# Patient Record
Sex: Female | Born: 1985 | Hispanic: No | Marital: Married | Smoking: Former smoker
Health system: Southern US, Community
[De-identification: ages and names within clinical notes are randomized; demographics above are authoritative.]

---

## 2014-05-13 LAB — OB RESULTS CONSOLE ABO/RH: RH Type: POSITIVE

## 2014-05-13 LAB — OB RESULTS CONSOLE RUBELLA ANTIBODY, IGM: Rubella: IMMUNE

## 2014-05-13 LAB — OB RESULTS CONSOLE HEPATITIS B SURFACE ANTIGEN: Hepatitis B Surface Ag: NEGATIVE

## 2014-05-13 LAB — OB RESULTS CONSOLE RPR: RPR: NONREACTIVE

## 2014-05-13 LAB — OB RESULTS CONSOLE ANTIBODY SCREEN: Antibody Screen: NEGATIVE

## 2014-05-13 LAB — OB RESULTS CONSOLE HIV ANTIBODY (ROUTINE TESTING): HIV: NONREACTIVE

## 2014-05-23 LAB — OB RESULTS CONSOLE GC/CHLAMYDIA
Chlamydia: NEGATIVE
Gonorrhea: NEGATIVE

## 2014-08-12 ENCOUNTER — Inpatient Hospital Stay (HOSPITAL_COMMUNITY): Admission: AD | Admit: 2014-08-12 | Payer: Self-pay | Source: Ambulatory Visit | Admitting: Obstetrics and Gynecology

## 2014-11-15 LAB — OB RESULTS CONSOLE GBS: GBS: NEGATIVE

## 2014-12-09 ENCOUNTER — Inpatient Hospital Stay (HOSPITAL_COMMUNITY): Payer: Managed Care, Other (non HMO) | Admitting: Anesthesiology

## 2014-12-09 ENCOUNTER — Encounter (HOSPITAL_COMMUNITY)
Admission: AD | Disposition: A | Discharge status: Home / Self Care | Payer: Self-pay | Source: Ambulatory Visit | Attending: Obstetrics and Gynecology

## 2014-12-09 ENCOUNTER — Inpatient Hospital Stay (HOSPITAL_COMMUNITY): Payer: Managed Care, Other (non HMO)

## 2014-12-09 ENCOUNTER — Inpatient Hospital Stay (HOSPITAL_COMMUNITY)
Admission: AD | Admit: 2014-12-09 | Discharge: 2014-12-12 | DRG: 766 | Disposition: A | Discharge status: Home / Self Care | Payer: Managed Care, Other (non HMO) | Source: Ambulatory Visit | Attending: Obstetrics and Gynecology | Admitting: Obstetrics and Gynecology

## 2014-12-09 ENCOUNTER — Encounter (HOSPITAL_COMMUNITY): Payer: Self-pay | Admitting: *Deleted

## 2014-12-09 DIAGNOSIS — O9081 Anemia of the puerperium: Secondary | ICD-10-CM | POA: Diagnosis not present

## 2014-12-09 DIAGNOSIS — D509 Iron deficiency anemia, unspecified: Secondary | ICD-10-CM | POA: Diagnosis present

## 2014-12-09 DIAGNOSIS — O99019 Anemia complicating pregnancy, unspecified trimester: Secondary | ICD-10-CM

## 2014-12-09 DIAGNOSIS — Z3A38 38 weeks gestation of pregnancy: Secondary | ICD-10-CM | POA: Diagnosis present

## 2014-12-09 DIAGNOSIS — O4290 Premature rupture of membranes, unspecified as to length of time between rupture and onset of labor, unspecified weeks of gestation: Secondary | ICD-10-CM | POA: Diagnosis present

## 2014-12-09 DIAGNOSIS — Z3403 Encounter for supervision of normal first pregnancy, third trimester: Secondary | ICD-10-CM | POA: Diagnosis present

## 2014-12-09 DIAGNOSIS — D62 Acute posthemorrhagic anemia: Secondary | ICD-10-CM | POA: Diagnosis not present

## 2014-12-09 DIAGNOSIS — Z09 Encounter for follow-up examination after completed treatment for conditions other than malignant neoplasm: Secondary | ICD-10-CM

## 2014-12-09 DIAGNOSIS — D259 Leiomyoma of uterus, unspecified: Secondary | ICD-10-CM | POA: Diagnosis present

## 2014-12-09 LAB — CBC
HCT: 31.7 % — ABNORMAL LOW (ref 36.0–46.0)
Hemoglobin: 10.6 g/dL — ABNORMAL LOW (ref 12.0–15.0)
MCH: 29.2 pg (ref 26.0–34.0)
MCHC: 33.4 g/dL (ref 30.0–36.0)
MCV: 87.3 fL (ref 78.0–100.0)
PLATELETS: 222 10*3/uL (ref 150–400)
RBC: 3.63 MIL/uL — AB (ref 3.87–5.11)
RDW: 13.3 % (ref 11.5–15.5)
WBC: 12.1 10*3/uL — AB (ref 4.0–10.5)

## 2014-12-09 LAB — AMNISURE RUPTURE OF MEMBRANE (ROM) NOT AT ARMC: AMNISURE: POSITIVE

## 2014-12-09 SURGERY — Surgical Case
Anesthesia: General

## 2014-12-09 MED ORDER — SODIUM CHLORIDE 0.9 % IV SOLN: 250.0000 mL | INTRAVENOUS | Status: DC

## 2014-12-09 MED ORDER — 0.9 % SODIUM CHLORIDE (POUR BTL) OPTIME
TOPICAL | Status: DC | PRN
  Administered 2014-12-09: 1000 mL

## 2014-12-09 MED ORDER — ONDANSETRON HCL 4 MG/2ML IJ SOLN
INTRAMUSCULAR | Status: AC
  Filled 2014-12-09: qty 2

## 2014-12-09 MED ORDER — ONDANSETRON HCL 4 MG/2ML IJ SOLN
4.0000 mg | INTRAMUSCULAR | Status: DC | PRN
  Administered 2014-12-09: 4 mg via INTRAVENOUS
  Filled 2014-12-09: qty 2

## 2014-12-09 MED ORDER — DIPHENHYDRAMINE HCL 50 MG/ML IJ SOLN: 12.5000 mg | Freq: Four times a day (QID) | INTRAMUSCULAR | Status: DC | PRN

## 2014-12-09 MED ORDER — PROPOFOL 10 MG/ML IV BOLUS
INTRAVENOUS | Status: AC
  Filled 2014-12-09: qty 20

## 2014-12-09 MED ORDER — HYDROMORPHONE 0.3 MG/ML IV SOLN
INTRAVENOUS | Status: DC
  Administered 2014-12-09: 0.3 mg via INTRAVENOUS
  Administered 2014-12-09: 0.5 mg via INTRAVENOUS
  Administered 2014-12-10: 0.8 mg via INTRAVENOUS
  Administered 2014-12-10: 1.39 mg via INTRAVENOUS
  Administered 2014-12-10: 0.399 mg via INTRAVENOUS
  Filled 2014-12-09: qty 25

## 2014-12-09 MED ORDER — OXYTOCIN 10 UNIT/ML IJ SOLN
INTRAMUSCULAR | Status: AC
  Filled 2014-12-09: qty 4

## 2014-12-09 MED ORDER — METHYLERGONOVINE MALEATE 0.2 MG/ML IJ SOLN: 0.2000 mg | INTRAMUSCULAR | Status: DC | PRN

## 2014-12-09 MED ORDER — MENTHOL 3 MG MT LOZG: 1.0000 | LOZENGE | OROMUCOSAL | Status: DC | PRN

## 2014-12-09 MED ORDER — KETOROLAC TROMETHAMINE 30 MG/ML IJ SOLN
30.0000 mg | Freq: Once | INTRAMUSCULAR | Status: AC
  Administered 2014-12-09: 30 mg via INTRAVENOUS

## 2014-12-09 MED ORDER — ONDANSETRON HCL 4 MG/2ML IJ SOLN: 4.0000 mg | Freq: Four times a day (QID) | INTRAMUSCULAR | Status: DC | PRN

## 2014-12-09 MED ORDER — OXYTOCIN BOLUS FROM INFUSION: 500.0000 mL | INTRAVENOUS | Status: DC

## 2014-12-09 MED ORDER — SENNOSIDES-DOCUSATE SODIUM 8.6-50 MG PO TABS
2.0000 | ORAL_TABLET | ORAL | Status: DC
  Administered 2014-12-10 – 2014-12-12 (×3): 2 via ORAL
  Filled 2014-12-09 (×4): qty 2

## 2014-12-09 MED ORDER — NALOXONE HCL 0.4 MG/ML IJ SOLN: 0.4000 mg | INTRAMUSCULAR | Status: DC | PRN

## 2014-12-09 MED ORDER — FENTANYL CITRATE 0.05 MG/ML IJ SOLN
INTRAMUSCULAR | Status: DC | PRN
  Administered 2014-12-09 (×2): 50 ug via INTRAVENOUS
  Administered 2014-12-09: 250 ug via INTRAVENOUS

## 2014-12-09 MED ORDER — FENTANYL CITRATE 0.05 MG/ML IJ SOLN
INTRAMUSCULAR | Status: AC
  Filled 2014-12-09: qty 2

## 2014-12-09 MED ORDER — SUCCINYLCHOLINE CHLORIDE 20 MG/ML IJ SOLN
INTRAMUSCULAR | Status: DC | PRN
  Administered 2014-12-09: 140 mg via INTRAVENOUS

## 2014-12-09 MED ORDER — LACTATED RINGERS IV SOLN: 500.0000 mL | INTRAVENOUS | Status: DC | PRN

## 2014-12-09 MED ORDER — PROPOFOL 10 MG/ML IV BOLUS
INTRAVENOUS | Status: DC | PRN
  Administered 2014-12-09: 200 mg via INTRAVENOUS

## 2014-12-09 MED ORDER — LIDOCAINE HCL (PF) 1 % IJ SOLN: 30.0000 mL | INTRAMUSCULAR | Status: DC | PRN

## 2014-12-09 MED ORDER — LACTATED RINGERS IV SOLN
INTRAVENOUS | Status: DC
  Administered 2014-12-10: 03:00:00 via INTRAVENOUS

## 2014-12-09 MED ORDER — OXYCODONE-ACETAMINOPHEN 5-325 MG PO TABS
2.0000 | ORAL_TABLET | ORAL | Status: DC | PRN
Start: 2014-12-09 — End: 2014-12-09

## 2014-12-09 MED ORDER — BISACODYL 10 MG RE SUPP: 10.0000 mg | Freq: Every day | RECTAL | Status: DC | PRN

## 2014-12-09 MED ORDER — OXYCODONE-ACETAMINOPHEN 5-325 MG PO TABS
1.0000 | ORAL_TABLET | ORAL | Status: DC | PRN
  Administered 2014-12-10 (×3): 1 via ORAL
  Filled 2014-12-09 (×3): qty 1

## 2014-12-09 MED ORDER — METHYLERGONOVINE MALEATE 0.2 MG PO TABS: 0.2000 mg | ORAL_TABLET | ORAL | Status: DC | PRN

## 2014-12-09 MED ORDER — OXYTOCIN 10 UNIT/ML IJ SOLN: 10.0000 [IU] | Freq: Once | INTRAMUSCULAR | Status: DC

## 2014-12-09 MED ORDER — KETOROLAC TROMETHAMINE 60 MG/2ML IM SOLN
INTRAMUSCULAR | Status: AC
  Filled 2014-12-09: qty 2

## 2014-12-09 MED ORDER — ONDANSETRON HCL 4 MG/2ML IJ SOLN
INTRAMUSCULAR | Status: DC | PRN
  Administered 2014-12-09: 4 mg via INTRAVENOUS

## 2014-12-09 MED ORDER — FERROUS SULFATE 325 (65 FE) MG PO TABS
325.0000 mg | ORAL_TABLET | Freq: Two times a day (BID) | ORAL | Status: DC
  Administered 2014-12-10 – 2014-12-12 (×5): 325 mg via ORAL
  Filled 2014-12-09 (×5): qty 1

## 2014-12-09 MED ORDER — CLINDAMYCIN PHOSPHATE 900 MG/50ML IV SOLN: 900.0000 mg | Freq: Three times a day (TID) | INTRAVENOUS | Status: DC

## 2014-12-09 MED ORDER — KETOROLAC TROMETHAMINE 30 MG/ML IJ SOLN
30.0000 mg | Freq: Once | INTRAMUSCULAR | Status: AC
  Administered 2014-12-09: 30 mg via INTRAMUSCULAR

## 2014-12-09 MED ORDER — ZOLPIDEM TARTRATE 5 MG PO TABS: 5.0000 mg | ORAL_TABLET | Freq: Every evening | ORAL | Status: DC | PRN

## 2014-12-09 MED ORDER — OXYTOCIN 40 UNITS IN LACTATED RINGERS INFUSION - SIMPLE MED: 1.0000 m[IU]/min | INTRAVENOUS | Status: DC

## 2014-12-09 MED ORDER — IBUPROFEN 600 MG PO TABS
600.0000 mg | ORAL_TABLET | Freq: Four times a day (QID) | ORAL | Status: DC
  Administered 2014-12-10 – 2014-12-12 (×10): 600 mg via ORAL
  Filled 2014-12-09 (×10): qty 1

## 2014-12-09 MED ORDER — DIPHENHYDRAMINE HCL 12.5 MG/5ML PO ELIX
12.5000 mg | ORAL_SOLUTION | Freq: Four times a day (QID) | ORAL | Status: DC | PRN
  Filled 2014-12-09: qty 5

## 2014-12-09 MED ORDER — DIBUCAINE 1 % RE OINT: 1.0000 "application " | TOPICAL_OINTMENT | RECTAL | Status: DC | PRN

## 2014-12-09 MED ORDER — DIPHENHYDRAMINE HCL 25 MG PO CAPS: 25.0000 mg | ORAL_CAPSULE | Freq: Four times a day (QID) | ORAL | Status: DC | PRN

## 2014-12-09 MED ORDER — FENTANYL CITRATE 0.05 MG/ML IJ SOLN
INTRAMUSCULAR | Status: AC
  Filled 2014-12-09: qty 5

## 2014-12-09 MED ORDER — LANOLIN HYDROUS EX OINT: 1.0000 "application " | TOPICAL_OINTMENT | CUTANEOUS | Status: DC | PRN

## 2014-12-09 MED ORDER — METHYLERGONOVINE MALEATE 0.2 MG/ML IJ SOLN
INTRAMUSCULAR | Status: DC | PRN
  Administered 2014-12-09: 0.2 mg via INTRAMUSCULAR

## 2014-12-09 MED ORDER — PROMETHAZINE HCL 25 MG/ML IJ SOLN: 6.2500 mg | INTRAMUSCULAR | Status: DC | PRN

## 2014-12-09 MED ORDER — SODIUM CHLORIDE 0.9 % IJ SOLN: 3.0000 mL | INTRAMUSCULAR | Status: DC | PRN

## 2014-12-09 MED ORDER — SODIUM CHLORIDE 0.9 % IJ SOLN: 3.0000 mL | Freq: Two times a day (BID) | INTRAMUSCULAR | Status: DC

## 2014-12-09 MED ORDER — HYDROMORPHONE HCL 1 MG/ML IJ SOLN
INTRAMUSCULAR | Status: AC
  Filled 2014-12-09: qty 1

## 2014-12-09 MED ORDER — ONDANSETRON HCL 4 MG PO TABS: 4.0000 mg | ORAL_TABLET | ORAL | Status: DC | PRN

## 2014-12-09 MED ORDER — CITRIC ACID-SODIUM CITRATE 334-500 MG/5ML PO SOLN: 30.0000 mL | ORAL | Status: DC | PRN

## 2014-12-09 MED ORDER — SIMETHICONE 80 MG PO CHEW
80.0000 mg | CHEWABLE_TABLET | ORAL | Status: DC
  Administered 2014-12-10 – 2014-12-12 (×3): 80 mg via ORAL
  Filled 2014-12-09 (×3): qty 1

## 2014-12-09 MED ORDER — SIMETHICONE 80 MG PO CHEW
80.0000 mg | CHEWABLE_TABLET | Freq: Three times a day (TID) | ORAL | Status: DC
  Administered 2014-12-10 – 2014-12-12 (×7): 80 mg via ORAL
  Filled 2014-12-09 (×6): qty 1

## 2014-12-09 MED ORDER — HYDROMORPHONE HCL 1 MG/ML IJ SOLN
0.2500 mg | INTRAMUSCULAR | Status: DC | PRN
  Administered 2014-12-09 (×2): 0.5 mg via INTRAVENOUS

## 2014-12-09 MED ORDER — OXYTOCIN 40 UNITS IN LACTATED RINGERS INFUSION - SIMPLE MED: 62.5000 mL/h | INTRAVENOUS | Status: DC

## 2014-12-09 MED ORDER — HYDROMORPHONE HCL 1 MG/ML IJ SOLN
INTRAMUSCULAR | Status: DC | PRN
  Administered 2014-12-09: 1 mg via INTRAVENOUS

## 2014-12-09 MED ORDER — PRENATAL MULTIVITAMIN CH
1.0000 | ORAL_TABLET | Freq: Every day | ORAL | Status: DC
  Administered 2014-12-10 – 2014-12-11 (×2): 1 via ORAL
  Filled 2014-12-09 (×2): qty 1

## 2014-12-09 MED ORDER — LACTATED RINGERS IV SOLN
INTRAVENOUS | Status: DC
  Administered 2014-12-09 (×2): via INTRAVENOUS

## 2014-12-09 MED ORDER — HYDROMORPHONE HCL 1 MG/ML IJ SOLN
INTRAMUSCULAR | Status: AC
  Administered 2014-12-09: 0.5 mg via INTRAVENOUS
  Filled 2014-12-09: qty 1

## 2014-12-09 MED ORDER — SUCCINYLCHOLINE CHLORIDE 20 MG/ML IJ SOLN
INTRAMUSCULAR | Status: AC
  Filled 2014-12-09: qty 1

## 2014-12-09 MED ORDER — NALBUPHINE HCL 10 MG/ML IJ SOLN: 5.0000 mg | INTRAMUSCULAR | Status: DC | PRN

## 2014-12-09 MED ORDER — BUTORPHANOL TARTRATE 1 MG/ML IJ SOLN: 1.0000 mg | INTRAMUSCULAR | Status: DC | PRN

## 2014-12-09 MED ORDER — AMPICILLIN SODIUM 2 G IJ SOLR
2.0000 g | Freq: Once | INTRAMUSCULAR | Status: DC
  Filled 2014-12-09: qty 2000

## 2014-12-09 MED ORDER — WITCH HAZEL-GLYCERIN EX PADS: 1.0000 "application " | MEDICATED_PAD | CUTANEOUS | Status: DC | PRN

## 2014-12-09 MED ORDER — OXYTOCIN 10 UNIT/ML IJ SOLN
40.0000 [IU] | INTRAVENOUS | Status: DC | PRN
  Administered 2014-12-09: 40 [IU] via INTRAVENOUS

## 2014-12-09 MED ORDER — OXYCODONE-ACETAMINOPHEN 5-325 MG PO TABS: 1.0000 | ORAL_TABLET | ORAL | Status: DC | PRN

## 2014-12-09 MED ORDER — CEFAZOLIN SODIUM-DEXTROSE 2-3 GM-% IV SOLR
INTRAVENOUS | Status: DC | PRN
  Administered 2014-12-09: 2 g via INTRAVENOUS

## 2014-12-09 MED ORDER — DEXTROSE IN LACTATED RINGERS 5 % IV SOLN
INTRAVENOUS | Status: DC
  Administered 2014-12-09: 19:00:00 via INTRAVENOUS

## 2014-12-09 MED ORDER — FLEET ENEMA 7-19 GM/118ML RE ENEM: 1.0000 | ENEMA | Freq: Every day | RECTAL | Status: DC | PRN

## 2014-12-09 MED ORDER — MIDAZOLAM HCL 2 MG/2ML IJ SOLN
INTRAMUSCULAR | Status: DC | PRN
  Administered 2014-12-09: 2 mg via INTRAVENOUS

## 2014-12-09 MED ORDER — SODIUM CHLORIDE 0.9 % IJ SOLN: 9.0000 mL | INTRAMUSCULAR | Status: DC | PRN

## 2014-12-09 MED ORDER — CEFAZOLIN SODIUM-DEXTROSE 2-3 GM-% IV SOLR
2.0000 g | Freq: Three times a day (TID) | INTRAVENOUS | Status: DC
  Administered 2014-12-10: 2 g via INTRAVENOUS
  Filled 2014-12-09 (×3): qty 50

## 2014-12-09 MED ORDER — ACETAMINOPHEN 325 MG PO TABS: 650.0000 mg | ORAL_TABLET | ORAL | Status: DC | PRN

## 2014-12-09 MED ORDER — OXYCODONE-ACETAMINOPHEN 5-325 MG PO TABS: 2.0000 | ORAL_TABLET | ORAL | Status: DC | PRN

## 2014-12-09 MED ORDER — MEPERIDINE HCL 25 MG/ML IJ SOLN: 6.2500 mg | INTRAMUSCULAR | Status: DC | PRN

## 2014-12-09 MED ORDER — TERBUTALINE SULFATE 1 MG/ML IJ SOLN: 0.2500 mg | Freq: Once | INTRAMUSCULAR | Status: DC | PRN

## 2014-12-09 MED ORDER — SIMETHICONE 80 MG PO CHEW: 80.0000 mg | CHEWABLE_TABLET | ORAL | Status: DC | PRN

## 2014-12-09 MED ORDER — MIDAZOLAM HCL 2 MG/2ML IJ SOLN
INTRAMUSCULAR | Status: AC
  Filled 2014-12-09: qty 2

## 2014-12-09 SURGICAL SUPPLY — 40 items
BARRIER ADHS 3X4 INTERCEED (GAUZE/BANDAGES/DRESSINGS) ×3 IMPLANT
BENZOIN TINCTURE PRP APPL 2/3 (GAUZE/BANDAGES/DRESSINGS) IMPLANT
CLAMP CORD UMBIL (MISCELLANEOUS) IMPLANT
CLOSURE WOUND 1/2 X4 (GAUZE/BANDAGES/DRESSINGS)
CLOTH BEACON ORANGE TIMEOUT ST (SAFETY) ×3 IMPLANT
CONTAINER PREFILL 10% NBF 15ML (MISCELLANEOUS) IMPLANT
DRAPE SHEET LG 3/4 BI-LAMINATE (DRAPES) IMPLANT
DRSG OPSITE POSTOP 4X10 (GAUZE/BANDAGES/DRESSINGS) ×3 IMPLANT
DURAPREP 26ML APPLICATOR (WOUND CARE) ×3 IMPLANT
ELECT REM PT RETURN 9FT ADLT (ELECTROSURGICAL) ×3
ELECTRODE REM PT RTRN 9FT ADLT (ELECTROSURGICAL) ×1 IMPLANT
EXTRACTOR VACUUM M CUP 4 TUBE (SUCTIONS) IMPLANT
EXTRACTOR VACUUM M CUP 4' TUBE (SUCTIONS)
GLOVE BIOGEL PI IND STRL 7.0 (GLOVE) ×1 IMPLANT
GLOVE BIOGEL PI INDICATOR 7.0 (GLOVE) ×2
GLOVE ECLIPSE 6.5 STRL STRAW (GLOVE) ×3 IMPLANT
GOWN STRL REUS W/TWL LRG LVL3 (GOWN DISPOSABLE) ×6 IMPLANT
KIT ABG SYR 3ML LUER SLIP (SYRINGE) IMPLANT
NEEDLE HYPO 25X1 1.5 SAFETY (NEEDLE) ×3 IMPLANT
NEEDLE HYPO 25X5/8 SAFETYGLIDE (NEEDLE) IMPLANT
NS IRRIG 1000ML POUR BTL (IV SOLUTION) ×3 IMPLANT
PACK C SECTION WH (CUSTOM PROCEDURE TRAY) ×3 IMPLANT
PAD OB MATERNITY 4.3X12.25 (PERSONAL CARE ITEMS) ×3 IMPLANT
RTRCTR C-SECT PINK 25CM LRG (MISCELLANEOUS) IMPLANT
STAPLER VISISTAT 35W (STAPLE) IMPLANT
STRIP CLOSURE SKIN 1/2X4 (GAUZE/BANDAGES/DRESSINGS) IMPLANT
SUT CHROMIC GUT AB #0 18 (SUTURE) IMPLANT
SUT MNCRL 0 VIOLET CTX 36 (SUTURE) ×3 IMPLANT
SUT MON AB 4-0 PS1 27 (SUTURE) IMPLANT
SUT MONOCRYL 0 CTX 36 (SUTURE) ×6
SUT PLAIN 2 0 (SUTURE)
SUT PLAIN 2 0 XLH (SUTURE) IMPLANT
SUT PLAIN ABS 2-0 CT1 27XMFL (SUTURE) IMPLANT
SUT VIC AB 0 CT1 36 (SUTURE) ×6 IMPLANT
SUT VIC AB 2-0 CT1 27 (SUTURE) ×2
SUT VIC AB 2-0 CT1 TAPERPNT 27 (SUTURE) ×1 IMPLANT
SUT VIC AB 4-0 PS2 27 (SUTURE) IMPLANT
SYR CONTROL 10ML LL (SYRINGE) ×3 IMPLANT
TOWEL OR 17X24 6PK STRL BLUE (TOWEL DISPOSABLE) ×3 IMPLANT
TRAY FOLEY CATH 14FR (SET/KITS/TRAYS/PACK) IMPLANT

## 2014-12-09 NOTE — Progress Notes (Signed)
Dr Garwin Brothers notified of patients chief complaints and arrival.   Orders for [facility]

## 2014-12-09 NOTE — Progress Notes (Signed)
S:  C/o painful ctx  O: VE: ft/1cm/-2 Intracervical  Balloon placed. Copious clear fluid  Noted through  Foley additional port  FHR thereafter 89 then cont to decrease to 45-50 Maternal positional changes On hand and knee MaternalO2  A/P: fetal bradycardia Not responsive to above measures. To OR for stat C/S

## 2014-12-09 NOTE — Brief Op Note (Signed)
12/09/2014  3:51 PM  PATIENT:  Kara Phillips  29 y.o. female  PRE-OPERATIVE DIAGNOSIS: Fetal bradycardia, SROM, term gestation  POST-OPERATIVE DIAGNOSIS:  Fetal  Bradycardia 2nd to cord compression, SROM, term gestation  PROCEDURE: Emergency primary Cesarean section, Buddy Duty hysterotomy  SURGEON:  Surgeon(s) and Role:    * Servando Salina, MD - Primary  PHYSICIAN ASSISTANT:   ASSISTANTS: none   ANESTHESIA:   general   Findings: live female with loop of  Pale cord next to shoulder, nl tubes and ovaries. Small  SS Fibroids noted Apgar 8/9 cord ph 7.10 posterior placenta  EBL:  Total I/O In: 2200 [I.V.:2200] Out: 530 [Urine:30; Blood:500]  BLOOD ADMINISTERED:none  DRAINS: none   LOCAL MEDICATIONS USED:  NONE  SPECIMEN:  Source of Specimen:  placenta  DISPOSITION OF SPECIMEN:  PATHOLOGY  COUNTS:  YES  TOURNIQUET:  * No tourniquets in log *  DICTATION: .Other Dictation: Dictation Number [id number]  PLAN OF CARE: Admit to inpatient   PATIENT DISPOSITION:  PACU - hemodynamically stable.   Delay start of Pharmacological VTE agent (>24hrs) due to surgical blood loss or risk of bleeding: not applicable

## 2014-12-09 NOTE — MAU Note (Signed)
Water broke around 1am contractions 4 am

## 2014-12-09 NOTE — H&P (Signed)
Kara Phillips is a 29 y.o. female presenting @ 87 5/[redacted] weeks gestation with c/o intermittent leakage of fluid since 1 am. (+) ctx. GBS cx neg. Fern negative. amniosure positive. Bedside sono confirmed vtx  Maternal Medical History:  Reason for admission: Rupture of membranes and contractions.   Contractions: Onset was 6-12 hours ago.    Fetal activity: Perceived fetal activity is normal.    Prenatal complications: no prenatal complications Prenatal Complications - Diabetes: none.    OB History    Gravida Para Term Preterm AB TAB SAB Ectopic Multiple Living   1              History reviewed. No pertinent past medical history. History reviewed. No pertinent past surgical history. Family History: family history is not on file. Social History:  reports that she quit smoking about 8 months ago. She has never used smokeless tobacco. She reports that she does not drink alcohol or use illicit drugs.   Prenatal Transfer Tool  Maternal Diabetes: No Genetic Screening: Normal Maternal Ultrasounds/Referrals: Normal Fetal Ultrasounds or other Referrals:  None Maternal Substance Abuse:  No Significant Maternal Medications:  None Significant Maternal Lab Results:  Lab values include: Group B Strep negative Other Comments:  None  Review of Systems  All other systems reviewed and are negative.   Dilation: Closed Effacement (%): Thick Station: -3 Exam by:: Dr Garwin Brothers There were no vitals taken for this visit. Maternal Exam:  Uterine Assessment: Contraction frequency is irregular.   Abdomen: Patient reports no abdominal tenderness. Estimated fetal weight is 6 1/2 lb.   Fetal presentation: vertex  Introitus: Normal vulva.   Physical Exam  Constitutional: She is oriented to person, place, and time. She appears well-developed and well-nourished.  HENT:  Head: Atraumatic.  Eyes: EOM are normal.  Neck: Neck supple.  Cardiovascular: Regular rhythm.   Respiratory: Effort normal.  GI:  Soft.  Musculoskeletal: She exhibits no edema.  Neurological: She is alert and oriented to person, place, and time.  Skin: Skin is warm and dry.  Psychiatric: She has a normal mood and affect.   VE ft/50/-2  amniosure positive fern neg  Prenatal labs: ABO, Rh:  O positive Antibody:  neg Rubella:  Immune RPR:   neg HBsAg:   neg HIV:   NR GBS:   neg  Tracing: baseline 120-125 (+)accels to 150 Ctx q 1-4 mins with couplet  Assessment/Plan: SROM Gbs cx neg Term gestation P) admit routine labs, analgesic prn. Intracervical balloon and pitocin   Calla Wedekind A 12/09/2014, 1:08 PM

## 2014-12-09 NOTE — Anesthesia Preprocedure Evaluation (Signed)
Anesthesia Evaluation  Patient identified by MRN, date of birth, ID band Patient awake  Preop documentation limited or incomplete due to emergent nature of procedure.  Airway Mallampati: I       Dental   Pulmonary former smoker,    Pulmonary exam normal       Cardiovascular     Neuro/Psych    GI/Hepatic   Endo/Other    Renal/GU      Musculoskeletal   Abdominal Normal abdominal exam  (+)   Peds  Hematology   Anesthesia Other Findings   Reproductive/Obstetrics (+) Pregnancy                             Anesthesia Physical Anesthesia Plan  ASA: II and emergent  Anesthesia Plan: General   Post-op Pain Management:    Induction: Intravenous, Rapid sequence and Cricoid pressure planned  Airway Management Planned: Oral ETT  Additional Equipment:   Intra-op Plan:   Post-operative Plan: Extubation in OR  Informed Consent: I have reviewed the patients History and Physical, chart, labs and discussed the procedure including the risks, benefits and alternatives for the proposed anesthesia with the patient or authorized representative who has indicated his/her understanding and acceptance.   Dental advisory given and Only emergency history available  Plan Discussed with: CRNA and Surgeon  Anesthesia Plan Comments:         Anesthesia Quick Evaluation

## 2014-12-09 NOTE — Transfer of Care (Signed)
Immediate Anesthesia Transfer of Care Note  Patient: Kara Phillips  Procedure(s) Performed: Procedure(s): CESAREAN SECTION (N/A)  Patient Location: PACU  Anesthesia Type:General  Level of Consciousness: awake and alert   Airway & Oxygen Therapy: Patient Spontanous Breathing and Patient connected to nasal cannula oxygen  Post-op Assessment: Report given to RN and Post -op Vital signs reviewed and stable  Post vital signs: Reviewed and stable  Last Vitals:  Filed Vitals:   12/09/14 1328  BP: 127/91  Pulse: 97  Temp: 36.9 C  Resp: 18    Complications: No apparent anesthesia complications

## 2014-12-09 NOTE — Anesthesia Postprocedure Evaluation (Signed)
Anesthesia Post Note  Patient: Kara Phillips  Procedure(s) Performed: Procedure(s) (LRB): CESAREAN SECTION (N/A)  Anesthesia type: General  Patient location: PACU  Post pain: Pain level controlled  Post assessment: Post-op Vital signs reviewed  Last Vitals:  Filed Vitals:   12/09/14 1645  BP: 128/80  Pulse: 73  Temp:   Resp: 14    Post vital signs: Reviewed  Level of consciousness: sedated  Complications: No apparent anesthesia complications

## 2014-12-09 NOTE — OR Nursing (Signed)
No time out performed due to the emergent nature of the case. No count performed due to emergent nature of the case. No foreign bodies per xray at end of case.

## 2014-12-09 NOTE — Progress Notes (Signed)
Carleene Overlie RN in room.

## 2014-12-09 NOTE — Op Note (Signed)
NAMEWREN, Kara Phillips NO.:  [id number]  MEDICAL RECORD NO.:  [id number]  LOCATION:  2376                          FACILITY:  [facility]  PHYSICIAN:  Servando Salina, M.D.DATE OF BIRTH:  10-06-1985  DATE OF PROCEDURE:  12/09/2014 DATE OF DISCHARGE:                              OPERATIVE REPORT   PREOPERATIVE DIAGNOSES:  Fetal bradycardia, spontaneous rupture of membranes, intrauterine gestation at 38+ weeks.  PROCEDURE:  Emergency primary cesarean section, Kerr hysterotomy.  POSTOPERATIVE DIAGNOSES:  Fetal bradycardia secondary to cord compression, spontaneous rupture of membranes, term gestation.  ANESTHESIA:  General.  SURGEON:  Servando Salina, MD.  ASSISTANT:  None.  DESCRIPTION OF PROCEDURE:  The patient was transferred to the operating room.  In the operating room, the fetal heart rate was still noted to be low in the 40s.  After splashing Betadine, an indwelling Foley bladder catheter being placed and the intracervical balloon being removed, and the patient sterilely draped, induction of general anesthesia was then performed. A Pfannenstiel skin incision was quickly made, carried down to the rectus fascia.  Rectus fascia was bluntly and sharply dissected off the rectus muscle in superior and inferior fashion.  Rectus muscles split in midline.  The parietal peritoneum was entered bluntly.  The vesicouterine peritoneum was opened transversely. The bladder was bluntly dissected off the lower uterine segment, displaced inferiorly with a bladder retractor.  A low transverse incision was made in the midline and extended bluntly using upward and downward traction on the incision.  The baby was subsequently delivered. The loop of cord next to the chest was noted which was pale, devoid of blood was noted.  The baby was subsequently quickly delivered.  Cord was clamped, cut, bulb suctioned.  The baby was transferred to awaiting pediatricians who assigned Apgars  of 8 and 9 at 1 and 5 minutes.  Cord gases and cord pH obtained. The placenta which was posterior was manually removed.  Uterine cavity was cleaned of debris.  Uterine incision had no extension, but there was a small hematoma noted on the left lateral aspect of the incision.  The incision was then carefully closed with 0 Monocryl running lock stitch first layer, second layer was imbricated using 0 Monocryl suture.  Bleeding on the right side of incision had hemostases with cauterization and couple of figure-of-eight sutures placement.  Small subserosal fibroids were noted on the surface of the uterus anteriorly. Both tubes and ovaries were noted to be normal.  There was some bowel adhesion in that left lower retroperitoneal area which was remained untouched.  The area for the small hematoma was kept being checked and did not appear to be expanding and thus was not opened.  Abdomen was then irrigated and suctioned of debris.  Interceed was placed over the lower uterine segment.  The parietal peritoneum was then closed with 2-0 Vicryl.  The rectus fascia was closed with 0 Vicryl x2.  The subcutaneous area was irrigated, small bleeders cauterized.  Interrupted 2-0 plain sutures placed and the skin approximated with Ethicon staples. Cord gases had been obtained.  SPECIMENS:  Placenta sent to Pathology.  ESTIMATED BLOOD LOSS:  500 mL.  INTRAOPERATIVE FLUID:  1500 mL.  URINE OUTPUT:  30 mL clear yellow urine.  An x-ray was done since there was no count was done due to the emergency of the emergency of the situation.  The baby was transferred to the regular nursery with the father.  The patient was extubated in good condition and was transferred to the recovery room in stable.     Servando Salina, M.D.     [place]/MEDQ  D:  12/09/2014  T:  12/09/2014  Job:  [id number]

## 2014-12-09 NOTE — Consult Note (Signed)
Neonatology Note:   Attendance at C-section:    I was asked by Dr. Garwin Brothers to attend this Stat C/S at term due to fetal bradycardia. The mother is a G1P0 O pos, GBS neg with an uncomplicated pregnancy. She presented today with SROM shortly before coming to hospital and had just arrived on L & D when the fetal HR was noted to be in the 40s. Stat C/S performed under general anesthesia. Delivered vertex, with cord pressed against chest. Amniotic fluid clear. Infant with good spontaneous cry and tone. After being placed on the warmer table, the cord was seen to be bleeding and the surgical clamp had torn off. We grasped the cord and placed a plastic clamp on it; estimate 5-6 ml blood loss. Needed only minimal bulb suctioning. Ap 8/9. Lungs clear to ausc in DR. Baby appears vigorous and well-perfused. I spoke with his father at the bedside. Baby and his father went to CN to care of Pediatrician.   Real Cons, MD

## 2014-12-10 LAB — CBC
HCT: 23.8 % — ABNORMAL LOW (ref 36.0–46.0)
HEMOGLOBIN: 7.7 g/dL — AB (ref 12.0–15.0)
MCH: 28.5 pg (ref 26.0–34.0)
MCHC: 32.4 g/dL (ref 30.0–36.0)
MCV: 88.1 fL (ref 78.0–100.0)
PLATELETS: 187 10*3/uL (ref 150–400)
RBC: 2.7 MIL/uL — AB (ref 3.87–5.11)
RDW: 13.4 % (ref 11.5–15.5)
WBC: 14.7 10*3/uL — AB (ref 4.0–10.5)

## 2014-12-10 LAB — ABO/RH: ABO/RH(D): O POS

## 2014-12-10 MED ORDER — SCOPOLAMINE 1 MG/3DAYS TD PT72
1.0000 | MEDICATED_PATCH | TRANSDERMAL | Status: DC
  Administered 2014-12-10: 1.5 mg via TRANSDERMAL
  Filled 2014-12-10: qty 1

## 2014-12-10 MED ORDER — ONDANSETRON HCL 4 MG PO TABS: 4.0000 mg | ORAL_TABLET | Freq: Three times a day (TID) | ORAL | Status: DC | PRN

## 2014-12-10 MED ORDER — MAGNESIUM OXIDE 400 (241.3 MG) MG PO TABS
200.0000 mg | ORAL_TABLET | Freq: Two times a day (BID) | ORAL | Status: DC
Start: 2014-12-10 — End: 2014-12-12
  Administered 2014-12-10 – 2014-12-12 (×5): 200 mg via ORAL
  Filled 2014-12-10 (×5): qty 0.5

## 2014-12-10 NOTE — Progress Notes (Signed)
POSTOPERATIVE DAY # 1 S/P Emergent C/S for Fetal Bradycardia secondary to Cord Compression with SROM   S:         Reports feeling good, but more sore today             Tolerating po intake / no Nausea / no Vomiting / no Flatus / no BM             Bleeding is light             Pain controlled with Dilaudid PCA - ready to switch to oral medication             Up ad lib / ambulatory/ voiding QS  Newborn formula feeding  / Circumcision - planning tomorrow   O:  VS: BP 123/69 mmHg  Pulse 96  Temp(Src) 98.3 F (36.8 C) (Oral)  Resp 18  Ht 5\' 3"  (1.6 m)  Wt 71.215 kg (157 lb)  BMI 27.82 kg/m2  SpO2 98%  Breastfeeding? Unknown   LABS:               Recent Labs  12/09/14 1230 12/10/14 0605  WBC 12.1* 14.7*  HGB 10.6* 7.7*  PLT 222 187               Bloodtype: --/--/O POS (03/13 1230)  Rubella: Immune (08/16 0000)                                             I&O: Intake/Output      03/13 0701 - 03/14 0700 03/14 0701 - 03/15 0700   P.O. 300    I.V. (mL/kg) 2273.9 (31.9)    Total Intake(mL/kg) 2573.9 (36.1)    Urine (mL/kg/hr) 1255 350 (1.6)   Blood 500    Total Output 1755 350   Net +818.9 -350                     Physical Exam:             Alert and Oriented X3  Lungs: Clear and unlabored  Heart: regular rate and rhythm / no mumurs  Abdomen: soft, non-tender, non-distended, +active bowel sounds             Fundus: firm, non-tender, U-1             Dressing: Honeycomb dressing clean / dry / intact              Incision:  approximated with staples / no erythema / no ecchymosis / no drainage / no evidence of seroma  Perineum: intact  Lochia: light  Extremities: +1 dependent edema BLE, no calf pain or tenderness, negative Homans  A:        POD # 1 S/P Emergent C/S for Fetal Bradycardia secondary to Cord Compression with SROM            Doing well - stable  ABL Anemia - asymptomatic, on Ferrous Sulfate   P:        Routine postoperative care              Flatus comfort  care reviewed   Breast care for formula feeding mothers reviewed   D/C IV PCA Dilaudid   Ambulate in halls today   Kassie Mends, [place]

## 2014-12-10 NOTE — Progress Notes (Signed)
Patient states she became nauseated when she took last dose of percocet; however, she "took a nap and slept through it." Patient requests order for zofran "just in case" she becomes nauseated after she takes another dose of percocet. Encouraged patient to only take medication when she has food on her stomach and reminded her of her scope patch behind her ear. Notified Artelia Laroche, CNM of patient's request. CNM ordered zofran, but encouraged discussing issues of constipation with the patient if taking zofran frequently. Patient verbalizes understanding and states she will only ask for it if needed. Maxwell Caul, Leretha Dykes [place]

## 2014-12-10 NOTE — Anesthesia Postprocedure Evaluation (Signed)
  Anesthesia Post-op Note  Patient: Kara Phillips  Procedure(s) Performed: Procedure(s): CESAREAN SECTION (N/A)  Patient Location: Mother/Baby  Anesthesia Type:General  Level of Consciousness: awake, alert , oriented and patient cooperative  Airway and Oxygen Therapy: Patient Spontanous Breathing;SaO2 96%  Post-op Pain: mild  Post-op Assessment: Patient's Cardiovascular Status Stable, Respiratory Function Stable, No signs of Nausea or vomiting and Pain level controlled  Post-op Vital Signs: stable  Last Vitals:  Filed Vitals:   12/10/14 0627  BP: 123/69  Pulse: 96  Temp: 36.8 C  Resp: 18    Complications: No apparent anesthesia complications

## 2014-12-10 NOTE — Addendum Note (Signed)
Addendum  created 12/10/14 5784 by Georgeanne Nim, CRNA   Modules edited: Notes Section   Notes Section:  File: [id number]

## 2014-12-11 ENCOUNTER — Encounter (HOSPITAL_COMMUNITY): Payer: Self-pay | Admitting: Obstetrics and Gynecology

## 2014-12-11 NOTE — Progress Notes (Signed)
POSTOPERATIVE DAY # 2 S/P Emergent C/S for Fetal Bradycardia secondary to Cord Compression with SROM   S:         Reports feeling good; much better than yesterday             Tolerating po intake / no Nausea / no Vomiting - wearing Scopolamine patch/ (+) Flatus / No BM             Bleeding is light             Pain controlled with Ibuprofen and Percocet              Up ad lib / ambulatory/ voiding QS  Newborn formula feeding  / Circumcision - in progress now   O:  VS: BP 113/57 mmHg  Pulse 73  Temp(Src) 98.5 F (36.9 C) (Oral)  Resp 18  Ht 5\' 3"  (1.6 m)  Wt 71.215 kg (157 lb)  BMI 27.82 kg/m2  SpO2 99%  Breastfeeding   LABS:                Recent Labs  12/09/14 1230 12/10/14 0605  WBC 12.1* 14.7*  HGB 10.6* 7.7*  PLT 222 187               Bloodtype: O POS (03/13 1230)  Rubella: Immune (08/16 0000)                                             I&O: Intake/Output      03/14 0701 - 03/15 0700 03/15 0701 - 03/16 0700   P.O.     I.V. (mL/kg)     Total Intake(mL/kg)     Urine (mL/kg/hr) 700 (0.4)    Blood     Total Output 700     Net -700                       Physical Exam:             Alert and Oriented X3  Lungs: Clear and unlabored  Heart: regular rate and rhythm / no mumurs  Abdomen: soft, non-tender, non-distended, +active bowel sounds             Fundus: firm, non-tender, U-2             Dressing: Honeycomb dressing clean / dry / intact              Incision:  Skin well-approximated with staples / no erythema / no ecchymosis / no drainage / no evidence of seroma  Perineum: intact  Lochia: light  Extremities: Trace edema BLE, no calf pain or tenderness, negative Homans  A:        POD # 2 S/P Emergent C/S for Fetal Bradycardia secondary to Cord Compression with SROM            Doing well - stable  ABL Anemia - asymptomatic, on Ferrous Sulfate   P:        Routine postoperative care              Flatus comfort care reviewed   Breast care for formula  feeding mothers reviewed   Ambulate in halls today   Anticipate discharge home tomorrow  Kara Congress MSN, CNM 12/11/2014 10:02 AM

## 2014-12-12 DIAGNOSIS — D62 Acute posthemorrhagic anemia: Secondary | ICD-10-CM | POA: Diagnosis not present

## 2014-12-12 DIAGNOSIS — D509 Iron deficiency anemia, unspecified: Secondary | ICD-10-CM | POA: Diagnosis present

## 2014-12-12 DIAGNOSIS — O99019 Anemia complicating pregnancy, unspecified trimester: Secondary | ICD-10-CM

## 2014-12-12 MED ORDER — FERROUS SULFATE 325 (65 FE) MG PO TABS: 325.0000 mg | ORAL_TABLET | Freq: Two times a day (BID) | ORAL | Status: AC

## 2014-12-12 MED ORDER — IBUPROFEN 600 MG PO TABS: 600.0000 mg | ORAL_TABLET | Freq: Four times a day (QID) | ORAL | Status: AC | PRN

## 2014-12-12 MED ORDER — OXYCODONE-ACETAMINOPHEN 5-325 MG PO TABS: 1.0000 | ORAL_TABLET | ORAL | Status: AC | PRN

## 2014-12-12 MED ORDER — MAGNESIUM OXIDE 400 (241.3 MG) MG PO TABS: 200.0000 mg | ORAL_TABLET | Freq: Two times a day (BID) | ORAL | Status: AC

## 2014-12-12 NOTE — Discharge Summary (Signed)
POSTOPERATIVE DISCHARGE SUMMARY:  Patient ID: Kara Phillips MRN: [id number] DOB/AGE: Jan 24, 1986 29 y.o.  Admit date: 12/09/2014 Admission Diagnoses: term PROM, 38.[redacted] wks gestation   Discharge date: 12/12/2014 Discharge Diagnoses: S/P C/S on 12/09/14, IDA with compounding ABL anemia        Prenatal history: G1P1001   EDC: 12/18/2014, by Other Basis  Prenatal care at [facility] since [redacted] wks gestation. Primary provider: Dr. Garwin Brothers Prenatal course complicated by IDA, excessive weight gain.  Prenatal labs: ABO, Rh: --/--/O POS (03/13 1230)  Antibody: Negative (08/16 0000) Rubella:   IMMUNE RPR: Nonreactive (08/16 0000)  HBsAg: Negative (08/16 0000)  HIV: Non-reactive (08/16 0000)  GBS: Negative (02/18 0000)  GTT: 111  Medical / Surgical History :  Past medical history: History reviewed. No pertinent past medical history.  Past surgical history:  Past Surgical History  Procedure Laterality Date  . Cesarean section N/A 12/09/2014    Procedure: CESAREAN SECTION;  Surgeon: Servando Salina, MD;  Location: [facility];  Service: Obstetrics;  Laterality: N/A;     Medications on Admission: No prescriptions prior to admission    Allergies: Review of patient's allergies indicates no known allergies.   Intrapartum Course:  Admitted for PROM, Pitocin augmentation, cervical balloon, fetal bradycardia, stat CS.   Postpartum Course: Complicated by ABL anemia. Discharged on POD #3.  Physical Exam:   VSS: Blood pressure 103/49, pulse 68, temperature 97.9 F (36.6 C), temperature source Oral, resp. rate 18, height 5\' 3"  (1.6 m), weight 71.215 kg (157 lb), SpO2 100 %, unknown if currently breastfeeding.  LABS:  Recent Labs  12/10/14 0605  WBC 14.7*  HGB 7.7*  PLT 187    General: Alert and oriented x3 Heart: RRR Lungs: CTA bilaterally GI: soft, non-tender, non-distended, BS x4 Lochia: small Uterus: firm below umbilicus Incision: well approximated; honeycomb  dressing-no significant erythema, drainage, or edema Extremities: No edema, Homans neg   Newborn Data Live born female  Birth Weight: 6 lb 5.2 oz (2870 g) APGAR: 8, 9  See operative report for further details  Home with mother.  Discharge Instructions:  Wound Care: keep clean and dry / remove honeycomb POD 6 Postpartum Instructions: [facility] discharge booklet - instructions reviewed Medications:    Medication List    STOP taking these medications        calcium carbonate 500 MG chewable tablet  Commonly known as:  TUMS - dosed in mg elemental calcium     diphenhydramine-acetaminophen 25-500 MG Tabs  Commonly known as:  TYLENOL PM      TAKE these medications        ferrous sulfate 325 (65 FE) MG tablet  Take 1 tablet (325 mg total) by mouth 2 (two) times daily with a meal.     ibuprofen 600 MG tablet  Commonly known as:  ADVIL,MOTRIN  Take 1 tablet (600 mg total) by mouth every 6 (six) hours as needed.     magnesium oxide 400 (241.3 MG) MG tablet  Commonly known as:  MAG-OX  Take 0.5 tablets (200 mg total) by mouth 2 (two) times daily.     oxyCODONE-acetaminophen 5-325 MG per tablet  Commonly known as:  PERCOCET/ROXICET  Take 1-2 tablets by mouth every 4 (four) hours as needed (for pain scale equal to or greater than 7).            Follow-up Information    Follow up with COUSINS,SHERONETTE A, MD. Schedule an appointment as soon as possible for a visit in 2  days.   Specialty:  Obstetrics and Gynecology   Why:  staple removal   Contact information:   [address] [place] [address] [phone]       Follow up with COUSINS,SHERONETTE A, MD. Schedule an appointment as soon as possible for a visit in 6 weeks.   Specialty:  Obstetrics and Gynecology   Why:  postpartum check   Contact information:   [address] [place] [address] [phone]         Signed: Julianne Handler, Delane Ginger MSN, CNM 12/12/2014, 3:11 PM

## 2014-12-12 NOTE — Plan of Care (Signed)
Problem: Discharge Progression Outcomes Goal: Remove staples per MD order Outcome: Completed/Met Date Met:  12/12/14 Staples to be removed in the physician's office on Friday.

## 2014-12-12 NOTE — Progress Notes (Signed)
POD # 3  Subjective: Pt reports feeling well, ready for discharge/ Pain controlled with Motrin Tolerating po/Voiding without problems/ No n/v/ Flatus present No dizziness or SOB Activity: ad lib Bleeding is light Newborn info:  Information for the patient's newborn:  Janda, Cargo [[id number]]  female  / Circumcision: done/ Feeding: bottle   Objective: VS: VS:  Filed Vitals:   12/10/14 1831 12/11/14 0535 12/11/14 1803 12/12/14 0612  BP: 113/66 113/57 122/72 103/49  Pulse: 99 73 68 68  Temp: 97.7 F (36.5 C) 98.5 F (36.9 C) 98.3 F (36.8 C) 97.9 F (36.6 C)  TempSrc: Oral Oral Oral Oral  Resp:  18 18   Height:      Weight:      SpO2: 99%   100%    I&O: Intake/Output      03/15 0701 - 03/16 0700 03/16 0701 - 03/17 0700   Urine (mL/kg/hr)     Total Output       Net              LABS:  Recent Labs  12/09/14 1230 12/10/14 0605  WBC 12.1* 14.7*  HGB 10.6* 7.7*  PLT 222 187                           Physical Exam:  General: alert and cooperative CV: Regular rate and rhythm Resp: CTA bilaterally Abdomen: soft, nontender, normal bowel sounds Incision: healing well, no drainage, no erythema, no hernia, no seroma, no swelling, well approximated with staples, honeycomb dsg c/d/i Uterine Fundus: firm, below umbilicus, nontender Lochia: minimal Ext: extremities normal, atraumatic, no cyanosis or edema and Homans sign is negative, no sign of DVT    Assessment: POD # 3/ G1P1001/ S/P C/Section d/t fetal bradycardia  IDA with compounding ABL anemia Doing well and stable for discharge home  Plan: Lactation suppression techniques discussed Discharge home RX's: Ibuprofen 600mg  po Q 6 hrs prn pain #30 Refill x 1 Percocet 5/325 1 - 2 tabs po every 4 hrs prn pain #30 Refill x 0 Niferex 150mg  po BID #60 Refill x 1 Mag Oxide 200 mg po daily #30, 1 refill [facility] booklet given    Signed: Julianne Handler, Delane Ginger, MSN, CNM 12/12/2014, 9:29 AM

## 2016-07-01 IMAGING — CR DG ABDOMEN 1V
1 series · 1 of 1 positions shown · non-contrast
Comparison: Portable exam 2929 hr without priors for comparison.

CLINICAL DATA: Incorrect sponge count

EXAM:
ABDOMEN - 1 VIEW

[view not recorded]
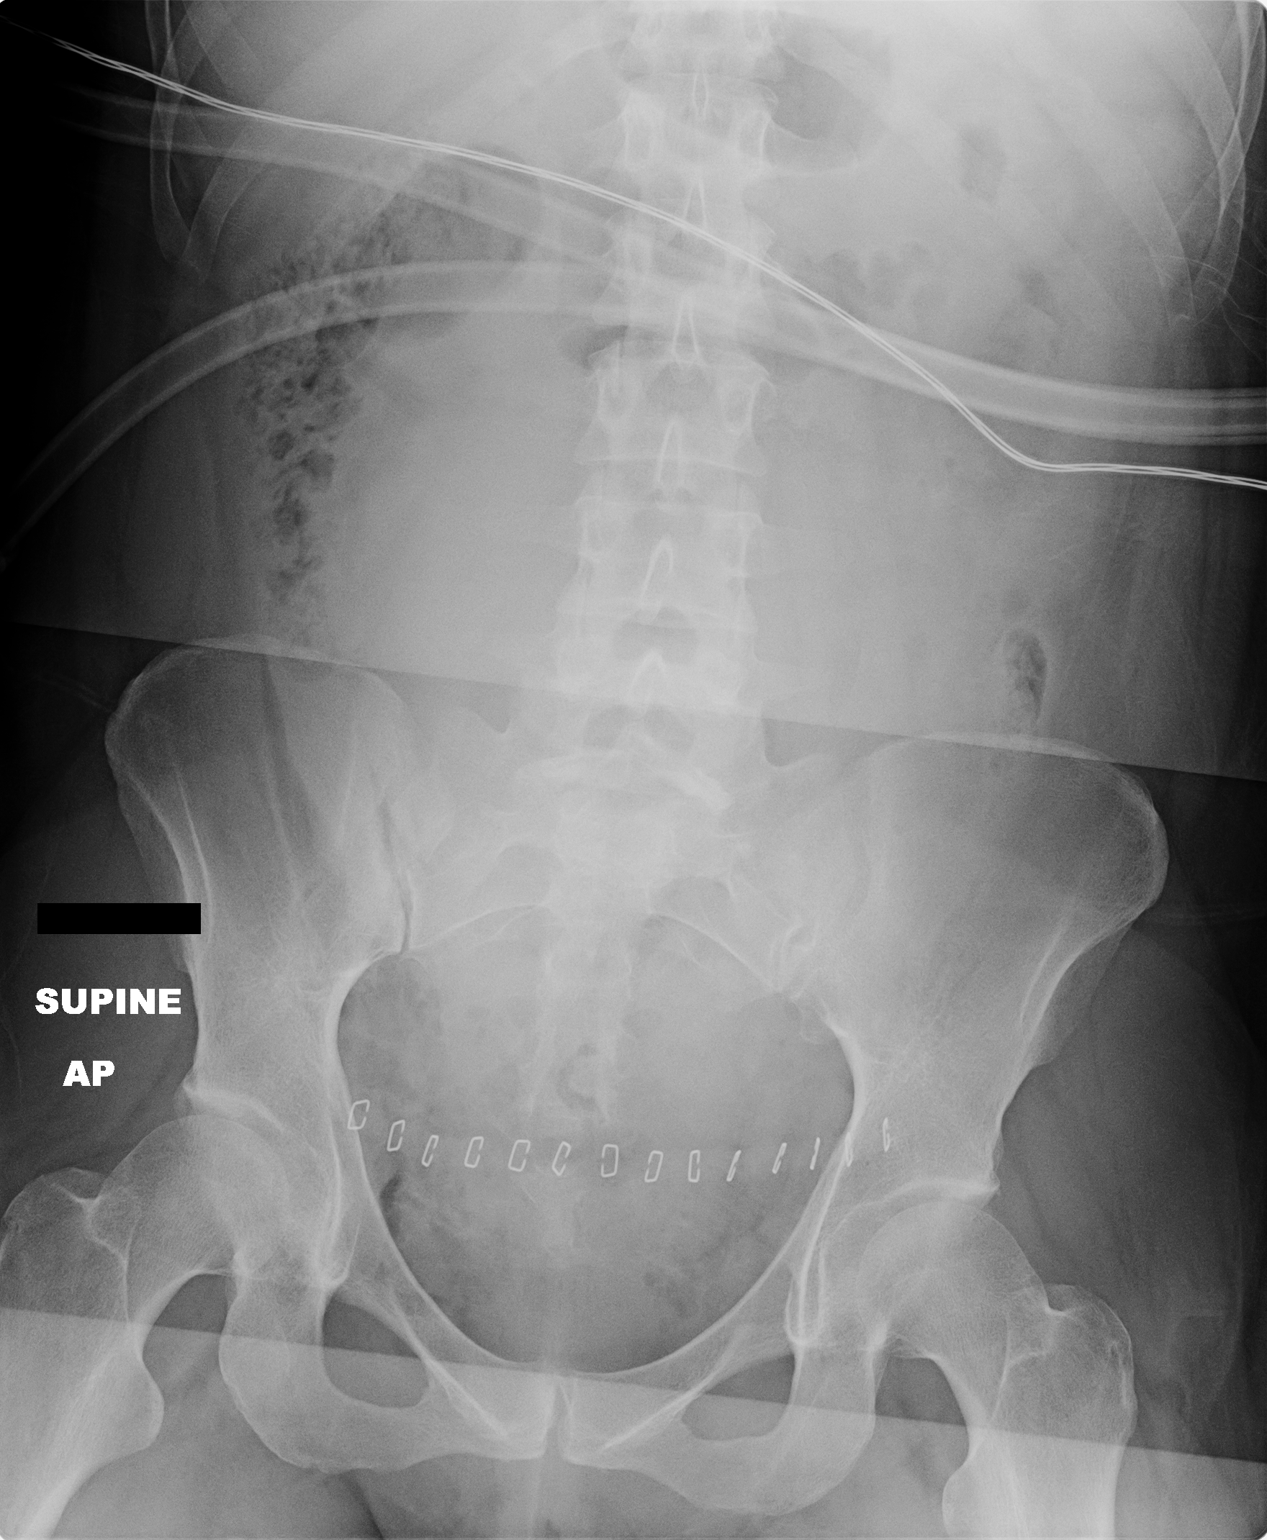

[1 of 1 positions shown; findings below may reference images not displayed]

FINDINGS: Skin clips in pelvis.

No retained radiopaque foreign bodies are identified in the abdomen
or pelvis.

Probable bedding artifacts at far lateral LEFT upper quadrant
extending external to the costal margins.

Support devices traverse upper abdomen.
IMPRESSION: No definite retained radiopaque foreign bodies identified.

## 2019-08-30 ENCOUNTER — Other Ambulatory Visit: Payer: Self-pay

## 2019-08-30 DIAGNOSIS — Z20822 Contact with and (suspected) exposure to covid-19: Secondary | ICD-10-CM

## 2019-09-03 LAB — NOVEL CORONAVIRUS, NAA: SARS-CoV-2, NAA: NOT DETECTED
